# Patient Record
Sex: Female | Born: 1980 | Hispanic: Yes | Marital: Married | Smoking: Former smoker
Health system: Southern US, Community
[De-identification: ages and names within clinical notes are randomized; demographics above are authoritative.]

## PROBLEM LIST (undated history)

## (undated) DIAGNOSIS — R519 Headache, unspecified: Secondary | ICD-10-CM

## (undated) DIAGNOSIS — R51 Headache: Secondary | ICD-10-CM

## (undated) HISTORY — PX: DIAGNOSTIC LAPAROSCOPY: SUR761

## (undated) HISTORY — PX: TUBAL LIGATION: SHX77

## (undated) HISTORY — PX: CERVICAL BIOPSY  W/ LOOP ELECTRODE EXCISION: SUR135

---

## 2008-04-02 ENCOUNTER — Emergency Department: Payer: Self-pay | Admitting: Emergency Medicine

## 2013-07-31 ENCOUNTER — Ambulatory Visit: Payer: Self-pay | Admitting: Obstetrics and Gynecology

## 2013-07-31 LAB — HEMOGLOBIN: HGB: 12.9 g/dL (ref 12.0–16.0)

## 2013-08-08 ENCOUNTER — Ambulatory Visit: Payer: Self-pay | Admitting: Obstetrics and Gynecology

## 2013-08-11 LAB — PATHOLOGY REPORT

## 2014-08-15 NOTE — Op Note (Signed)
PATIENT NAME:  Lisa Dunn, Lisa Dunn MR#:  [id number] DATE OF BIRTH:  10-21-1980  DATE OF PROCEDURE:  08/08/2013  PREOPERATIVE DIAGNOSES:  1.  Chronic right pelvic pain.  2.  Intra-abdominal intrauterine device.  3.  Elective permanent sterilization.   POSTOPERATIVE DIAGNOSES: 1.  Chronic right pelvic pain.  2.  Intra-abdominal intrauterine device.  3.  Elective permanent sterilization.    PROCEDURES:  1.  Laparoscopic retrieval of intra-abdominal IUD. 2.  Distal right salpingectomy.  3.  Bilateral tubal cautery.  4.  Lysis of adhesions.   SURGEON: Laverta Baltimore, MD  ANESTHESIA: General endotracheal anesthesia.   INDICATIONS: This is a 34 year old, gravida 3, para 3  patient with a 5 history of right pelvic pain. The patient's workup documented an intra-abdominal IUD to the right side of the pelvis. The patient is also interested in permanent sterilization and reconfirms her choice for this prior to our procedure.   DESCRIPTION OF PROCEDURE: After adequate general endotracheal anesthesia, the patient's abdomen, perineum, and vagina were prepped. The patient was sterilely draped. A single-tooth tenaculum was applied on the anterior cervix and a Kahn cannula was placed into the endocervical canal to be used for uterine manipulation during the procedure. Gloves were changed. A 12 mm infraumbilical incision was made after injecting the skin with 0.5% Marcaine. The laparoscope was advanced into the abdominal cavity under direct visualization with the Optiview cannula. Once access to the abdominal cavity, the cavity was insufflated with carbon dioxide. The patient was placed in Trendelenburg. A second port site was placed 3 cm medial to the left anterior iliac spine and a 5 mm port was advanced into the abdominal cavity under direct visualization. Graspers were used to move the omentum. The IUD string was visualized and picked up and it was noted that the Mirena IUD was tangled with the  distal portion of the right fallopian tube. A third port site was placed, this time, right lower quadrant 3 cm medial to the right anterior iliac spine and Harmonic scalpel was brought up to the operative field. A distal right salpingectomy was performed with the IUD entangled with this. The IUD and the distal portion of fallopian tube were removed through the left port site. There were adhesions from the abdominal wall to the anterior uterus. These were taken down with the Harmonic scalpel as well. Each fallopian tube was then cauterized with the Kleppinger cautery at 4 separate places on the fallopian tube. Pictures were taken. The upper abdomen appeared normal and ovaries appeared normal.   The patient's abdomen was deflated and incisions were closed with the deep 2-0 Vicryl suture for the infraumbilical port site and then all skin incisions were closed with interrupted 4-0 Vicryl sutures, Dermabond placed and Tegaderm was placed over top of these. The single-tooth tenaculum was removed from the cervix and silver nitrate was used to control bleeding at the tenacula site. There were no complications. Of note, the patient's bladder was drained prior to commencement of the case and yielded 50 mL of urine. Intraoperative fluids 900 mL. The patient was taken to the recovery room in good condition.   ____________________________ Boykin Nearing, MD tjs:aw D: 08/08/2013 08:45:51 ET T: 08/08/2013 10:18:43 ET JOB#: [id number]  cc: Boykin Nearing, MD, <Dictator> Boykin Nearing MD ELECTRONICALLY SIGNED 08/08/2013 13:16

## 2015-02-15 NOTE — H&P (Addendum)
Pt will be scheduled for a TAH + Bilateral salpingectomy

## 2015-02-15 NOTE — H&P (Signed)
Ms. Lisa Dunn is a 34 y.o. female scheduled for a TAH / bilateral salpingectomy for menorrhagia and s/p 3 c/s  U/s shows a 4x3 cm posterior cervical fibroid . EMBX negative    Past Medical History:  has a past medical history of Cervical dysplasia; Clotting disorder (10/2014); Fibroid; Migraines; and Varicosities of leg.  Past Surgical History:  has a past surgical history that includes Cesarean section; Cervical biopsy w/ loop electrode excision; Pelvic laparoscopy; and Tubal ligation. Family History: family history includes Hypertension in her mother. Social History:  reports that she quit smoking about 8 years ago. She does not have any smokeless tobacco history on file. She reports that she drinks alcohol. She reports that she does not use illicit drugs. OB/GYN History:  OB History    Gravida Para Term Preterm AB TAB SAB Ectopic Multiple Living   3 3 3       3       Allergies: has No Known Allergies. Medications: No current outpatient prescriptions on file.  Review of Systems: General:   No fatigue or weight loss Eyes:   No vision changes Ears:   No hearing difficulty Respiratory:   No cough or shortness of breath Pulmonary:   No asthma or shortness of breath Cardiovascular:  No chest pain, palpitations, dyspnea on exertion Gastrointestinal:  No abdominal bloating, chronic diarrhea, constipations, masses, pain or hematochezia Genitourinary:  No hematuria, dysuria, abnormal vaginal discharge, pelvic pain, Menometrorrhagia Lymphatic:  No swollen lymph nodes Musculoskeletal: No muscle weakness Neurologic:  No extremity weakness, syncope, seizure disorder Psychiatric:  No history of depression, delusions or suicidal/homicidal ideation   Exam:      Vitals:   01/22/15 0923  BP: 114/81  Pulse: 76    Body mass index is 28.72 kg/(m^2).  WDWN hispanic female in NAD  Lungs: CTA  CV : RRR without murmur   Neck: no  thyromegaly Abdomen: soft , no mass, normal active bowel sounds, non-tender, no rebound tenderness Pelvic: tanner stage 5 ,  External genitalia: vulva /labia no lesions Urethra: no prolapse Vagina: normal physiologic d/c Cervix: no lesions, no cervical motion tenderness  Uterus: normal size shape and contour, non-tender Adnexa: no mass, non-tender  Rectovaginal:  SIS: done no evidence of endometrial pathology   Impression:   The primary encounter diagnosis was Menorrhagia with regular cycle. Diagnoses of Dyspareunia (CODE) and Intramural leiomyoma of uterus were also pertinent to this visit.  3 prior c/s . Possible pelvic adhesions  Plan:   Scheduled for TAH and bilat salpingectomy  Risks and benefits discussed with the patient . All questions answered .  Gwen Her [name] MD

## 2015-02-16 ENCOUNTER — Encounter
Admission: RE | Admit: 2015-02-16 | Discharge: 2015-02-16 | Disposition: A | Discharge status: Home / Self Care | Payer: BLUE CROSS/BLUE SHIELD | Source: Ambulatory Visit | Attending: Obstetrics and Gynecology | Admitting: Obstetrics and Gynecology

## 2015-02-16 DIAGNOSIS — Z01818 Encounter for other preprocedural examination: Secondary | ICD-10-CM | POA: Insufficient documentation

## 2015-02-16 DIAGNOSIS — N920 Excessive and frequent menstruation with regular cycle: Secondary | ICD-10-CM | POA: Diagnosis not present

## 2015-02-16 HISTORY — DX: Headache, unspecified: R51.9

## 2015-02-16 HISTORY — DX: Headache: R51

## 2015-02-16 LAB — CBC
HCT: 37.3 % (ref 35.0–47.0)
HEMOGLOBIN: 12.6 g/dL (ref 12.0–16.0)
MCH: 29.2 pg (ref 26.0–34.0)
MCHC: 33.8 g/dL (ref 32.0–36.0)
MCV: 86.3 fL (ref 80.0–100.0)
PLATELETS: 255 10*3/uL (ref 150–440)
RBC: 4.32 MIL/uL (ref 3.80–5.20)
RDW: 13.6 % (ref 11.5–14.5)
WBC: 6.4 10*3/uL (ref 3.6–11.0)

## 2015-02-16 LAB — BASIC METABOLIC PANEL
ANION GAP: 6 (ref 5–15)
BUN: 12 mg/dL (ref 6–20)
CHLORIDE: 106 mmol/L (ref 101–111)
CO2: 24 mmol/L (ref 22–32)
Calcium: 8.9 mg/dL (ref 8.9–10.3)
Creatinine, Ser: 0.7 mg/dL (ref 0.44–1.00)
GFR calc Af Amer: >60 mL/min (ref >60)
GFR calc non Af Amer: >60 mL/min (ref >60)
Glucose, Bld: 98 mg/dL (ref 65–99)
POTASSIUM: 4.2 mmol/L (ref 3.5–5.1)
SODIUM: 136 mmol/L (ref 135–145)

## 2015-02-16 LAB — ABO/RH: ABO/RH(D): O NEG

## 2015-02-16 LAB — TYPE AND SCREEN
ABO/RH(D): O NEG
Antibody Screen: NEGATIVE

## 2015-02-16 MED ORDER — FLEET ENEMA 7-19 GM/118ML RE ENEM
1.0000 | ENEMA | Freq: Once | RECTAL | Status: DC
  Filled 2015-02-16: qty 1

## 2015-02-16 NOTE — Patient Instructions (Signed)
  Your procedure is scheduled on: March 01, 2015 (Monday) Report to Day Surgery.[facility]) To find out your arrival time please call [phone] between 1PM - 3PM on February 26, 2015(Friday)  Remember: Instructions that are not followed completely may result in serious medical risk, up to and including death, or upon the discretion of your surgeon and anesthesiologist your surgery may need to be rescheduled.    __x__ 1. Do not eat food or drink liquids after midnight. No gum chewing or hard candies.     __x__ 2. No Alcohol for 24 hours before or after surgery.   ____ 3. Bring all medications with you on the day of surgery if instructed.    _x___ 4. Notify your doctor if there is any change in your medical condition     (cold, fever, infections).     Do not wear jewelry, make-up, hairpins, clips or nail polish.  Do not wear lotions, powders, or perfumes. You may wear deodorant.  Do not shave 48 hours prior to surgery. Men may shave face and neck.  Do not bring valuables to the hospital.    [facility] is not responsible for any belongings or valuables.               Contacts, dentures or bridgework may not be worn into surgery.  Leave your suitcase in the car. After surgery it may be brought to your room.  For patients admitted to the hospital, discharge time is determined by your                treatment team.   Patients discharged the day of surgery will not be allowed to drive home.   Please read over the following fact sheets that you were given:   Surgical Site Infection Prevention   ____ Take these medicines the morning of surgery with A SIP OF WATER:    1.   2.   3.   4.  5.  6.  __x__ Fleet Enema (as directed) (Early morning of surgery)   _x___ Use CHG Soap as directed  ____ Use inhalers on the day of surgery  ____ Stop metformin 2 days prior to surgery    ____ Take 1/2 of usual insulin dose the night before surgery and none on the morning of surgery.    ____ Stop Coumadin/Plavix/aspirin on   _x___ Stop Anti-inflammatories on (Stop Ibuprofen now, Tylenol ok to take for pain if needed)   ____ Stop supplements until after surgery.    ____ Bring C-Pap to the hospital. ([place] to hospital the day of surgery)

## 2015-03-01 ENCOUNTER — Inpatient Hospital Stay: Payer: BLUE CROSS/BLUE SHIELD | Admitting: Anesthesiology

## 2015-03-01 ENCOUNTER — Encounter
Admission: AD | Disposition: A | Discharge status: Home / Self Care | Payer: Self-pay | Source: Ambulatory Visit | Attending: Obstetrics and Gynecology

## 2015-03-01 ENCOUNTER — Inpatient Hospital Stay
Admission: AD | Admit: 2015-03-01 | Discharge: 2015-03-04 | DRG: 743 | Disposition: A | Discharge status: Home / Self Care | Payer: BLUE CROSS/BLUE SHIELD | Source: Ambulatory Visit | Attending: Obstetrics and Gynecology | Admitting: Obstetrics and Gynecology

## 2015-03-01 DIAGNOSIS — D251 Intramural leiomyoma of uterus: Principal | ICD-10-CM | POA: Diagnosis present

## 2015-03-01 DIAGNOSIS — Z87891 Personal history of nicotine dependence: Secondary | ICD-10-CM | POA: Diagnosis not present

## 2015-03-01 DIAGNOSIS — N920 Excessive and frequent menstruation with regular cycle: Secondary | ICD-10-CM | POA: Diagnosis present

## 2015-03-01 DIAGNOSIS — D260 Other benign neoplasm of cervix uteri: Secondary | ICD-10-CM | POA: Diagnosis present

## 2015-03-01 DIAGNOSIS — Z8249 Family history of ischemic heart disease and other diseases of the circulatory system: Secondary | ICD-10-CM | POA: Diagnosis not present

## 2015-03-01 DIAGNOSIS — Z87410 Personal history of cervical dysplasia: Secondary | ICD-10-CM | POA: Diagnosis not present

## 2015-03-01 DIAGNOSIS — N941 Unspecified dyspareunia: Secondary | ICD-10-CM | POA: Diagnosis present

## 2015-03-01 DIAGNOSIS — Z9889 Other specified postprocedural states: Secondary | ICD-10-CM

## 2015-03-01 HISTORY — PX: BILATERAL SALPINGECTOMY: SHX5743

## 2015-03-01 HISTORY — PX: ABDOMINAL HYSTERECTOMY: SHX81

## 2015-03-01 LAB — POCT PREGNANCY, URINE: PREG TEST UR: NEGATIVE

## 2015-03-01 SURGERY — HYSTERECTOMY, ABDOMINAL
Anesthesia: General | Site: Uterus | Wound class: Clean Contaminated

## 2015-03-01 MED ORDER — ONDANSETRON HCL 4 MG PO TABS: 4.0000 mg | ORAL_TABLET | Freq: Four times a day (QID) | ORAL | Status: DC | PRN

## 2015-03-01 MED ORDER — FENTANYL CITRATE (PF) 100 MCG/2ML IJ SOLN
25.0000 ug | INTRAMUSCULAR | Status: DC | PRN
  Administered 2015-03-01 (×4): 25 ug via INTRAVENOUS

## 2015-03-01 MED ORDER — CEFOXITIN SODIUM-DEXTROSE 2-2.2 GM-% IV SOLR (PREMIX)
2.0000 g | INTRAVENOUS | Status: AC
  Administered 2015-03-01: 2000 mg via INTRAVENOUS
  Administered 2015-03-01: 2 g via INTRAVENOUS

## 2015-03-01 MED ORDER — ONDANSETRON HCL 4 MG/2ML IJ SOLN: 4.0000 mg | Freq: Four times a day (QID) | INTRAMUSCULAR | Status: DC | PRN

## 2015-03-01 MED ORDER — MORPHINE SULFATE 2 MG/ML IV SOLN
INTRAVENOUS | Status: DC
  Administered 2015-03-01: 1 mg via INTRAVENOUS
  Administered 2015-03-02: 15.5 mg via INTRAVENOUS

## 2015-03-01 MED ORDER — DEXAMETHASONE SODIUM PHOSPHATE 4 MG/ML IJ SOLN
INTRAMUSCULAR | Status: DC | PRN
  Administered 2015-03-01: 5 mg via INTRAVENOUS

## 2015-03-01 MED ORDER — HYDROMORPHONE HCL 1 MG/ML IJ SOLN
INTRAMUSCULAR | Status: DC | PRN
  Administered 2015-03-01: .6 mg via INTRAVENOUS
  Administered 2015-03-01: .4 mg via INTRAVENOUS

## 2015-03-01 MED ORDER — FAMOTIDINE 20 MG PO TABS
ORAL_TABLET | ORAL | Status: AC
  Administered 2015-03-01: 20 mg via ORAL
  Filled 2015-03-01: qty 1

## 2015-03-01 MED ORDER — ONDANSETRON HCL 4 MG/2ML IJ SOLN: 4.0000 mg | Freq: Once | INTRAMUSCULAR | Status: DC | PRN

## 2015-03-01 MED ORDER — ROCURONIUM BROMIDE 100 MG/10ML IV SOLN
INTRAVENOUS | Status: DC | PRN
  Administered 2015-03-01 (×2): 10 mg via INTRAVENOUS
  Administered 2015-03-01: 30 mg via INTRAVENOUS
  Administered 2015-03-01: 10 mg via INTRAVENOUS

## 2015-03-01 MED ORDER — ACETAMINOPHEN 10 MG/ML IV SOLN
INTRAVENOUS | Status: AC
  Filled 2015-03-01: qty 100

## 2015-03-01 MED ORDER — SODIUM CHLORIDE 0.9 % IJ SOLN: 9.0000 mL | INTRAMUSCULAR | Status: DC | PRN

## 2015-03-01 MED ORDER — SUGAMMADEX SODIUM 200 MG/2ML IV SOLN: INTRAVENOUS | Status: DC | PRN

## 2015-03-01 MED ORDER — SUGAMMADEX SODIUM 200 MG/2ML IV SOLN
INTRAVENOUS | Status: DC | PRN
  Administered 2015-03-01: 50 mg via INTRAVENOUS
  Administered 2015-03-01: 150 mg via INTRAVENOUS

## 2015-03-01 MED ORDER — EPHEDRINE SULFATE 50 MG/ML IJ SOLN
INTRAMUSCULAR | Status: DC | PRN
  Administered 2015-03-01: 10 mg via INTRAVENOUS

## 2015-03-01 MED ORDER — SENNOSIDES-DOCUSATE SODIUM 8.6-50 MG PO TABS: 1.0000 | ORAL_TABLET | Freq: Every evening | ORAL | Status: DC | PRN

## 2015-03-01 MED ORDER — LACTATED RINGERS IV SOLN
INTRAVENOUS | Status: DC
  Administered 2015-03-01 – 2015-03-02 (×3): via INTRAVENOUS

## 2015-03-01 MED ORDER — LIDOCAINE HCL (CARDIAC) 20 MG/ML IV SOLN
INTRAVENOUS | Status: DC | PRN
  Administered 2015-03-01: 60 mg via INTRAVENOUS

## 2015-03-01 MED ORDER — MIDAZOLAM HCL 2 MG/2ML IJ SOLN
INTRAMUSCULAR | Status: DC | PRN
  Administered 2015-03-01: 2 mg via INTRAVENOUS

## 2015-03-01 MED ORDER — LACTATED RINGERS IV SOLN
INTRAVENOUS | Status: DC
Start: 2015-03-01 — End: 2015-03-01

## 2015-03-01 MED ORDER — CEFOXITIN SODIUM-DEXTROSE 2-2.2 GM-% IV SOLR (PREMIX)
INTRAVENOUS | Status: AC
  Administered 2015-03-01: 2000 mg via INTRAVENOUS
  Filled 2015-03-01: qty 50

## 2015-03-01 MED ORDER — DEXMEDETOMIDINE HCL IN NACL 200 MCG/50ML IV SOLN
INTRAVENOUS | Status: DC | PRN
  Administered 2015-03-01: 10 ug via INTRAVENOUS

## 2015-03-01 MED ORDER — SIMETHICONE 80 MG PO CHEW
80.0000 mg | CHEWABLE_TABLET | Freq: Four times a day (QID) | ORAL | Status: DC | PRN
  Administered 2015-03-03: 80 mg via ORAL
  Filled 2015-03-01: qty 1

## 2015-03-01 MED ORDER — NALOXONE HCL 0.4 MG/ML IJ SOLN: 0.4000 mg | INTRAMUSCULAR | Status: DC | PRN

## 2015-03-01 MED ORDER — FAMOTIDINE 20 MG PO TABS
20.0000 mg | ORAL_TABLET | Freq: Once | ORAL | Status: AC
  Administered 2015-03-01: 20 mg via ORAL

## 2015-03-01 MED ORDER — LACTATED RINGERS IV SOLN
INTRAVENOUS | Status: DC
  Administered 2015-03-01 (×2): via INTRAVENOUS

## 2015-03-01 MED ORDER — FENTANYL CITRATE (PF) 100 MCG/2ML IJ SOLN
INTRAMUSCULAR | Status: DC | PRN
  Administered 2015-03-01: 50 ug via INTRAVENOUS
  Administered 2015-03-01: 100 ug via INTRAVENOUS

## 2015-03-01 MED ORDER — MORPHINE SULFATE 2 MG/ML IV SOLN
INTRAVENOUS | Status: AC
  Administered 2015-03-01: 1 mg
  Filled 2015-03-01: qty 25

## 2015-03-01 MED ORDER — KETOROLAC TROMETHAMINE 15 MG/ML IJ SOLN
INTRAMUSCULAR | Status: DC | PRN
  Administered 2015-03-01: 30 mg via INTRAVENOUS

## 2015-03-01 MED ORDER — DIPHENHYDRAMINE HCL 12.5 MG/5ML PO ELIX
12.5000 mg | ORAL_SOLUTION | Freq: Four times a day (QID) | ORAL | Status: DC | PRN
  Filled 2015-03-01: qty 5

## 2015-03-01 MED ORDER — ONDANSETRON HCL 4 MG/2ML IJ SOLN
INTRAMUSCULAR | Status: DC | PRN
Start: 2015-03-01 — End: 2015-03-01
  Administered 2015-03-01: 4 mg via INTRAVENOUS

## 2015-03-01 MED ORDER — PROPOFOL 10 MG/ML IV BOLUS
INTRAVENOUS | Status: DC | PRN
  Administered 2015-03-01: 160 mg via INTRAVENOUS

## 2015-03-01 MED ORDER — ACETAMINOPHEN 10 MG/ML IV SOLN
1000.0000 mg | Freq: Four times a day (QID) | INTRAVENOUS | Status: DC
  Administered 2015-03-01 – 2015-03-02 (×3): 1000 mg via INTRAVENOUS
  Filled 2015-03-01 (×4): qty 100

## 2015-03-01 MED ORDER — DIPHENHYDRAMINE HCL 50 MG/ML IJ SOLN: 12.5000 mg | Freq: Four times a day (QID) | INTRAMUSCULAR | Status: DC | PRN

## 2015-03-01 MED ORDER — FENTANYL CITRATE (PF) 100 MCG/2ML IJ SOLN
INTRAMUSCULAR | Status: AC
  Administered 2015-03-01: 25 ug via INTRAVENOUS
  Filled 2015-03-01: qty 2

## 2015-03-01 SURGICAL SUPPLY — 35 items
ARISTA IMPLANT
CANISTER SUCT 1200ML W/VALVE (MISCELLANEOUS) ×4 IMPLANT
CATH TRAY 16F METER LATEX (MISCELLANEOUS) ×4 IMPLANT
CHLORAPREP W/TINT 26ML (MISCELLANEOUS) ×4 IMPLANT
DRAPE LAP W/FLUID (DRAPES) ×4 IMPLANT
DRAPE UNDER BUTTOCK W/FLU (DRAPES) ×4 IMPLANT
DRSG TELFA 3X8 NADH (GAUZE/BANDAGES/DRESSINGS) ×4 IMPLANT
ELECT CAUTERY BLADE 6.4 (BLADE) ×4 IMPLANT
GAUZE SPONGE 4X4 12PLY STRL (GAUZE/BANDAGES/DRESSINGS) ×4 IMPLANT
GLOVE BIO SURGEON STRL SZ8 (GLOVE) ×4 IMPLANT
GOWN STRL REUS W/ TWL LRG LVL3 (GOWN DISPOSABLE) ×4 IMPLANT
GOWN STRL REUS W/ TWL XL LVL3 (GOWN DISPOSABLE) ×2 IMPLANT
GOWN STRL REUS W/TWL LRG LVL3 (GOWN DISPOSABLE) ×4
GOWN STRL REUS W/TWL XL LVL3 (GOWN DISPOSABLE) ×2
HEMOSTAT ARISTA ABSORB 1G (Miscellaneous) ×4 IMPLANT
KIT RM TURNOVER CYSTO AR (KITS) ×4 IMPLANT
LABEL OR SOLS (LABEL) ×4 IMPLANT
PACK BASIN MAJOR ARMC (MISCELLANEOUS) ×4 IMPLANT
PAD GROUND ADULT SPLIT (MISCELLANEOUS) ×4 IMPLANT
RETAINER VISCERA MED (MISCELLANEOUS) IMPLANT
SOL PREP PVP 2OZ (MISCELLANEOUS) ×4
SOLUTION PREP PVP 2OZ (MISCELLANEOUS) ×2 IMPLANT
SPONGE LAP 18X36 2PK (MISCELLANEOUS) ×4 IMPLANT
SPONGE XRAY 4X4 16PLY STRL (MISCELLANEOUS) ×4 IMPLANT
STAPLER SKIN PROX 35W (STAPLE) ×4 IMPLANT
SURGILUBE 2OZ TUBE FLIPTOP (MISCELLANEOUS) ×4 IMPLANT
SUT VIC AB 0 CT1 27 (SUTURE) ×6
SUT VIC AB 0 CT1 27XCR 8 STRN (SUTURE) ×6 IMPLANT
SUT VIC AB 0 CT1 36 (SUTURE) ×8 IMPLANT
SUT VIC AB 2-0 SH 27 (SUTURE) ×4
SUT VIC AB 2-0 SH 27XBRD (SUTURE) ×4 IMPLANT
SUT VICRYL PLUS ABS 0 54 (SUTURE) ×4 IMPLANT
SYR BULB IRRIG 60ML STRL (SYRINGE) ×4 IMPLANT
TRAY PREP VAG/GEN (MISCELLANEOUS) ×4 IMPLANT
WATER STERILE IRR 1000ML POUR (IV SOLUTION) ×4 IMPLANT

## 2015-03-01 NOTE — Transfer of Care (Signed)
Immediate Anesthesia Transfer of Care Note  Patient: Lisa Dunn  Procedure(s) Performed: Procedure(s): HYSTERECTOMY ABDOMINAL (N/A) BILATERAL SALPINGECTOMY  Patient Location: PACU  Anesthesia Type:General  Level of Consciousness: awake, alert  and oriented  Airway & Oxygen Therapy: Patient Spontanous Breathing and Patient connected to face mask oxygen  Post-op Assessment: Report given to RN and Post -op Vital signs reviewed and stable  Post vital signs: Reviewed and stable  Last Vitals:  Filed Vitals:   03/01/15 0917  BP:   Pulse:   Temp: 36.3 C  Resp:     Complications: No apparent anesthesia complications

## 2015-03-01 NOTE — Progress Notes (Signed)
Patient ID: Lisa Dunn, female   DOB: Oct 11, 1980, 34 y.o.   MRN: [id number] DOS some pain , currently on PCA  VSS:  abd soft  Good OU  A; stable  Plans cont PCA . NPO

## 2015-03-01 NOTE — Progress Notes (Signed)
Patient is hemodynamically stable w/o anu acute event. She remained on room and was able to maintain O2sat above 90%. Patient's was on PCA overnight, stated that her pain is well controlled. Husband was at bedside overnight. Will continue to monitor.  Patient care handed over to [facility] @0200 

## 2015-03-01 NOTE — Progress Notes (Signed)
Pt interviewed , no new concerns . Labs normal . Pt ready for TAH / bilateral salpingectomy .All questions answered

## 2015-03-01 NOTE — Anesthesia Preprocedure Evaluation (Addendum)
Anesthesia Evaluation  Patient identified by MRN, date of birth, ID band Patient awake    Reviewed: Allergy & Precautions, NPO status , Patient's Chart, lab work & pertinent test results, reviewed documented beta blocker date and time   Airway Mallampati: II  TM Distance: >3 FB     Dental  (+) Chipped   Pulmonary former smoker,           Cardiovascular      Neuro/Psych  Headaches,    GI/Hepatic   Endo/Other    Renal/GU      Musculoskeletal   Abdominal   Peds  Hematology   Anesthesia Other Findings Cardiac studies ok. Overbite.  Reproductive/Obstetrics                            Anesthesia Physical Anesthesia Plan  ASA: II  Anesthesia Plan: General   Post-op Pain Management:    Induction: Intravenous  Airway Management Planned: Oral ETT  Additional Equipment:   Intra-op Plan:   Post-operative Plan:   Informed Consent: I have reviewed the patients History and Physical, chart, labs and discussed the procedure including the risks, benefits and alternatives for the proposed anesthesia with the patient or authorized representative who has indicated his/her understanding and acceptance.     Plan Discussed with: CRNA  Anesthesia Plan Comments:         Anesthesia Quick Evaluation

## 2015-03-01 NOTE — Anesthesia Procedure Notes (Signed)
Procedure Name: Intubation Date/Time: 03/01/2015 10:03 AM Performed by: Justus Memory Pre-anesthesia Checklist: Patient identified, Emergency Drugs available, Suction available and Patient being monitored Patient Re-evaluated:Patient Re-evaluated prior to inductionOxygen Delivery Method: Circle system utilized Preoxygenation: Pre-oxygenation with 100% oxygen Intubation Type: IV induction Ventilation: Mask ventilation without difficulty Laryngoscope Size: Mac and 3 Grade View: Grade I Tube type: Oral Tube size: 7.0 mm Number of attempts: 1 Airway Equipment and Method: Stylet Placement Confirmation: ETT inserted through vocal cords under direct vision,  positive ETCO2,  CO2 detector and breath sounds checked- equal and bilateral Secured at: 20 cm Tube secured with: Tape Dental Injury: Teeth and Oropharynx as per pre-operative assessment

## 2015-03-01 NOTE — Brief Op Note (Signed)
03/01/2015  11:59 AM  PATIENT:  Lisa Dunn  34 y.o. female  PRE-OPERATIVE DIAGNOSIS:  MENORRHAGIA, fibroid , dyspareunia POST-OPERATIVE DIAGNOSIS:  MENORRHAGIA  PROCEDURE:  TAH , left salpingectomy  SURGEON:  Surgeon(s) and Role:    * Boykin Nearing, MD - Primary    * Benjaman Kindler, MD - Assisting  PHYSICIAN ASSISTANT:   ASSISTANTS: none   ANESTHESIA:   general  EBL:  Total I/O In: 1000 [I.V.:1000] Out: -   BLOOD ADMINISTERED:none  DRAINS: Urinary Catheter (Foley)   LOCAL MEDICATIONS USED:  NONE  SPECIMEN:  No Specimen and Source of Specimen:  uterus with fibroid , cervix and left fallopian tube   DISPOSITION OF SPECIMEN:  PATHOLOGY  COUNTS:  YES  TOURNIQUET:  * No tourniquets in log *  DICTATION: .Other Dictation: Dictation Number verbal  PLAN OF CARE: Admit to inpatient   PATIENT DISPOSITION:  PACU - hemodynamically stable.   Delay start of Pharmacological VTE agent (>24hrs) due to surgical blood loss or risk of bleeding: not applicable

## 2015-03-02 LAB — CBC
HCT: 29 % — ABNORMAL LOW (ref 35.0–47.0)
Hemoglobin: 10.3 g/dL — ABNORMAL LOW (ref 12.0–16.0)
MCH: 30.9 pg (ref 26.0–34.0)
MCHC: 35.5 g/dL (ref 32.0–36.0)
MCV: 87 fL (ref 80.0–100.0)
PLATELETS: 243 10*3/uL (ref 150–440)
RBC: 3.33 MIL/uL — ABNORMAL LOW (ref 3.80–5.20)
RDW: 13.5 % (ref 11.5–14.5)
WBC: 7.2 10*3/uL (ref 3.6–11.0)

## 2015-03-02 LAB — BASIC METABOLIC PANEL
Anion gap: 4 — ABNORMAL LOW (ref 5–15)
BUN: 10 mg/dL (ref 6–20)
CHLORIDE: 103 mmol/L (ref 101–111)
CO2: 27 mmol/L (ref 22–32)
CREATININE: 0.62 mg/dL (ref 0.44–1.00)
Calcium: 8.2 mg/dL — ABNORMAL LOW (ref 8.9–10.3)
GLUCOSE: 99 mg/dL (ref 65–99)
POTASSIUM: 3.6 mmol/L (ref 3.5–5.1)
SODIUM: 134 mmol/L — AB (ref 135–145)

## 2015-03-02 MED ORDER — DOCUSATE SODIUM 100 MG PO CAPS
100.0000 mg | ORAL_CAPSULE | Freq: Two times a day (BID) | ORAL | Status: DC
  Administered 2015-03-02 – 2015-03-04 (×5): 100 mg via ORAL
  Filled 2015-03-02 (×5): qty 1

## 2015-03-02 MED ORDER — OXYCODONE-ACETAMINOPHEN 5-325 MG PO TABS
1.0000 | ORAL_TABLET | ORAL | Status: DC | PRN
  Administered 2015-03-02: 2 via ORAL
  Administered 2015-03-02: 1 via ORAL
  Administered 2015-03-02 – 2015-03-03 (×4): 2 via ORAL
  Administered 2015-03-03: 1 via ORAL
  Administered 2015-03-03 – 2015-03-04 (×3): 2 via ORAL
  Filled 2015-03-02 (×5): qty 2
  Filled 2015-03-02: qty 1
  Filled 2015-03-02 (×2): qty 2
  Filled 2015-03-02: qty 1
  Filled 2015-03-02: qty 2

## 2015-03-02 MED ORDER — NAPROXEN 500 MG PO TABS
500.0000 mg | ORAL_TABLET | Freq: Two times a day (BID) | ORAL | Status: DC
  Administered 2015-03-02 – 2015-03-04 (×4): 500 mg via ORAL
  Filled 2015-03-02 (×6): qty 1

## 2015-03-02 NOTE — Anesthesia Postprocedure Evaluation (Signed)
  Anesthesia Post-op Note  Patient: Lisa Dunn  Procedure(s) Performed: Procedure(s): HYSTERECTOMY ABDOMINAL (N/A) BILATERAL SALPINGECTOMY  Anesthesia type:General  Patient location: PACU  Post pain: Pain level controlled  Post assessment: Post-op Vital signs reviewed, Patient's Cardiovascular Status Stable, Respiratory Function Stable, Patent Airway and No signs of Nausea or vomiting  Post vital signs: Reviewed and stable  Last Vitals:  Filed Vitals:   03/02/15 1107  BP: 114/76  Pulse: 76  Temp: 37.1 C  Resp: 18    Level of consciousness: awake, alert  and patient cooperative  Complications: No apparent anesthesia complications

## 2015-03-02 NOTE — Progress Notes (Signed)
1 Day Post-Op Procedure(s) (LRB): HYSTERECTOMY ABDOMINAL (N/A) BILATERAL SALPINGECTOMY  Subjective: Patient reports no problems voiding.  No c/o . Pain in good control   Objective: I have reviewed patient's vital signs. And labs   General: alert and cooperative Resp: clear to auscultation bilaterally Cardio: regular rate and rhythm, S1, S2 normal, no murmur, click, rub or gallop GI: hypoactive BS   Assessment: s/p Procedure(s): HYSTERECTOMY ABDOMINAL (N/A) BILATERAL SALPINGECTOMY: stable  Plan: Advance diet Advance to PO medication Discontinue IV fluids Clear liquids  LOS: 1 day    SCHERMERHORN,THOMAS 03/02/2015, 8:43 AM

## 2015-03-02 NOTE — Op Note (Signed)
NAMEMarland Kitchen  Dunn, GROSVENOR NO.:  [id number]  MEDICAL RECORD NO.:  [id number]  LOCATION:  348A                         FACILITY:  [facility]  PHYSICIAN:  Laverta Baltimore, MDDATE OF BIRTH:  05-12-1980  DATE OF PROCEDURE: DATE OF DISCHARGE:                              OPERATIVE REPORT   PREOPERATIVE DIAGNOSIS: 1. Menorrhagia. 2. Fibroid uterus. 3. Dyspareunia.  POSTOPERATIVE DIAGNOSIS: 1. Menorrhagia. 2. Fibroid uterus. 3. Dyspareunia.  PROCEDURES: 1. Total abdominal hysterectomy. 2. Left salpingectomy.  ANESTHESIA:  General endotracheal anesthesia.  SURGEON:  Laverta Baltimore, M.D.  FIRST ASSISTANT:  Leafy Ro.  INDICATIONS:  A 34 year old gravida 3, para 3 patient with a long history of menorrhagia, not controlled by conservative therapy.  The patient also with a known 4 cm cervical fibroid and dyspareunia.  The patient elects for definitive surgical intervention.  DESCRIPTION OF PROCEDURE:  After adequate general endotracheal anesthesia, the patient was placed in dorsal supine position.  Legs were placed in the Burna.  SCDs were placed on the legs previously. The patient was prepped and draped in normal sterile fashion.  Time-out was performed.  The patient did receive 2 g IV cefoxitin prior to commencement of the case and a Foley catheter was placed intraoperatively.  A Pfannenstiel incision was made 2 fingerbreadths above the symphysis pubis.  Sharp dissection was used to identify the fascia.  Fascia was opened in the midline and opened in a transverse fashion.  Superior aspect of the fascia was grasped with Kocher clamps and the recti muscles were dissected free.  Inferior aspect of the fascia was grasped with Kocher clamps and the pyramidalis muscle was dissected free.  Entry into the peritoneal cavity was accomplished sharply.  An O'Connor-O'Sullivan retractor was placed into the abdomen and the bowel was packed cephalad.  Two large  Kelly clamps were placed on the uterine cornua bilaterally used for retraction.  The round ligaments on both sides were clamped, transected, suture ligated with 0 Vicryl suture.  Anterior leaf of the broad ligament was incised along the bladder reflection.  There were some dense adhesions that were removed sharply.  Bladder was gently dissected off the lower uterine segment with a sponge stick thereafter.  Window was placed in both broad ligaments and the uterine cornua were bilaterally clamped, transected, and suture ligated with 0 Vicryl suture.  Uterine arteries were skeletonized bilaterally, clamped with Haney clamps, and suture ligated with 0 Vicryl suture.  Of note, there was a 4 cm multi-lobed fibroid that pushed off into the right broad ligament after clamping the right uterine artery and ligating this artery, the fibroid was enucleated after incising the serosa and grasping the fibroid with a double tooth tenaculum and with traction, removing the fibroid after securing the blood supply at its base.  Ureters were identified bilaterally with normal peristaltic activity prior to performing this enucleation of the right cervical fibroid.  The uterosacral ligaments were clamped on both sides, transected, and suture ligated with 0 Vicryl suture and the vaginal angles were then clamped and the uterus and cervix were removed intact.  Vaginal cuff was then closed with interrupted 0 Vicryl suture. Again, good ureteral peristalsis was identified at the end of the case. There was a small amount  of oozing at the vaginal cuff, juxtaposed to the bladder, this required 2 separate 2-0 Vicryl figure-of-eight sutures to control hemostasis.  Arista was used on the peritoneal area that was oozing close to the right ureter.  Good hemostasis resulted.  The patient's abdomen was copiously irrigated and good hemostasis was noted. All suture were then cut and the laparotomy sponges were removed. Sponge  count was correct.  The left fallopian tube was grasped with a Babcock clamp and the distal portion of fallopian tube was clamped and removed and suture ligated with 0 Vicryl suture.  There was no evidence of remaining fallopian tube on the patient's right side.  The patient's fascia was closed with 0 Vicryl suture in a running nonlocking fashion, 2 separate sutures were used.  Subcutaneous tissues were irrigated and bovied for hemostasis and the skin was reapproximated with staples.  COMPLICATIONS:  There were no complications.  ESTIMATED BLOOD LOSS:  150 mL.  URINE OUTPUT:  200 mL.  INTRAOPERATIVE FLUIDS:  800 mL.  The patient was taken to the recovery room in good condition.          ______________________________ Laverta Baltimore, MD     TS/MEDQ  D:  03/01/2015  T:  03/02/2015  Job:  [id number]

## 2015-03-03 LAB — SURGICAL PATHOLOGY

## 2015-03-03 NOTE — Progress Notes (Signed)
2 Days Post-Op Procedure(s) (LRB): HYSTERECTOMY ABDOMINAL (N/A) Left salpingectomy   Subjective: Patient reports + flatus.  Some pain   Objective: I have reviewed patient's vital signs.  General: alert and cooperative Resp: clear to auscultation bilaterally Cardio: regular rate and rhythm, S1, S2 normal, no murmur, click, rub or gallop GI: soft, non-tender; bowel sounds normal; no masses,  no organomegaly Incision  C/D/I Assessment: s/p Procedure(s): HYSTERECTOMY ABDOMINAL (N/A) BILATERAL SALPINGECTOMY: stable  Plan: Advance diet, pt may shower , d/c IV   LOS: 2 days    Tawyna Pellot 03/03/2015, 6:53 AM

## 2015-03-04 MED ORDER — SIMETHICONE 80 MG PO CHEW: 80.0000 mg | CHEWABLE_TABLET | Freq: Four times a day (QID) | ORAL | Status: DC | PRN

## 2015-03-04 MED ORDER — HYDROCODONE-ACETAMINOPHEN 5-325 MG PO TABS
1.0000 | ORAL_TABLET | Freq: Four times a day (QID) | ORAL | Status: DC | PRN
Start: 2015-03-04 — End: 2015-08-30

## 2015-03-04 MED ORDER — NAPROXEN 500 MG PO TABS: 500.0000 mg | ORAL_TABLET | Freq: Two times a day (BID) | ORAL | Status: DC

## 2015-03-04 MED ORDER — DOCUSATE SODIUM 100 MG PO CAPS: 100.0000 mg | ORAL_CAPSULE | Freq: Two times a day (BID) | ORAL | Status: DC

## 2015-03-04 NOTE — Discharge Summary (Signed)
Physician Discharge Summary  Patient ID: Lisa Dunn MRN: [id number] DOB/AGE: 34-08-1980 34 y.o.  Admit date: 03/01/2015 Discharge date: 03/04/2015  Admission Diagnoses:menorrhagia , fibroids , dyspareunia  Discharge Diagnoses: same  Active Problems:   Post-operative state   Discharged Condition: good  Hospital Course: pt under went a TAH and left salpingectomy . Recovery was uncomplicated . Tolerated reg diet and +flatus . Pain in good control   Consults: None  Significant Diagnostic Studies: labs: pod#1 HCT 29%  Treatments: n/a  Discharge Exam: Blood pressure 92/52, pulse 68, temperature 98.7 F (37.1 C), temperature source Oral, resp. rate 18, height 5\' 2"  (1.575 m), weight 158 lb (71.668 kg), last menstrual period 02/23/2015, SpO2 99 %. General appearance: alert and cooperative  Lungs CTA  cv rrr  aBD SOFT + bs  Incision C/D/I  Disposition: Final discharge disposition not confirmed  Discharge Instructions    Call MD for:  difficulty breathing, headache or visual disturbances    Complete by:  As directed      Call MD for:  extreme fatigue    Complete by:  As directed      Call MD for:  hives    Complete by:  As directed      Call MD for:  persistant dizziness or light-headedness    Complete by:  As directed      Call MD for:  persistant nausea and vomiting    Complete by:  As directed      Call MD for:  redness, tenderness, or signs of infection (pain, swelling, redness, odor or green/yellow discharge around incision site)    Complete by:  As directed      Call MD for:  severe uncontrolled pain    Complete by:  As directed      Call MD for:  temperature >100.4    Complete by:  As directed      Diet - low sodium heart healthy    Complete by:  As directed      Increase activity slowly    Complete by:  As directed      Remove staples    Complete by:  As directed   Place steri strips            Medication List    STOP taking these medications         ibuprofen 200 MG tablet  Commonly known as:  ADVIL,MOTRIN      TAKE these medications        docusate sodium 100 MG capsule  Commonly known as:  COLACE  Take 1 capsule (100 mg total) by mouth 2 (two) times daily.     HYDROcodone-acetaminophen 5-325 MG tablet  Commonly known as:  NORCO  Take 1-2 tablets by mouth every 6 (six) hours as needed for moderate pain.     naproxen 500 MG tablet  Commonly known as:  NAPROSYN  Take 1 tablet (500 mg total) by mouth 2 (two) times daily with a meal.     simethicone 80 MG chewable tablet  Commonly known as:  MYLICON  Chew 1 tablet (80 mg total) by mouth 4 (four) times daily as needed for flatulence.           Follow-up Information    Follow up with Lisa Rosenstock, MD In 2 weeks.   Specialty:  Obstetrics and Gynecology   Why:  For wound re-check   Contact information:   [address] [place] [address] [phone]  Signed: [name] 03/04/2015, 10:02 AM

## 2015-03-04 NOTE — Progress Notes (Signed)
Discharge instructions complete and prescriptions given. Patient discharged home at 1230.

## 2015-08-30 ENCOUNTER — Encounter: Payer: Self-pay | Admitting: Podiatry

## 2015-08-30 ENCOUNTER — Ambulatory Visit (INDEPENDENT_AMBULATORY_CARE_PROVIDER_SITE_OTHER): Payer: BLUE CROSS/BLUE SHIELD | Admitting: Podiatry

## 2015-08-30 VITALS — BP 126/78 | HR 68 | Resp 16

## 2015-08-30 DIAGNOSIS — G43909 Migraine, unspecified, not intractable, without status migrainosus: Secondary | ICD-10-CM | POA: Insufficient documentation

## 2015-08-30 DIAGNOSIS — N879 Dysplasia of cervix uteri, unspecified: Secondary | ICD-10-CM | POA: Insufficient documentation

## 2015-08-30 DIAGNOSIS — R87619 Unspecified abnormal cytological findings in specimens from cervix uteri: Secondary | ICD-10-CM | POA: Insufficient documentation

## 2015-08-30 DIAGNOSIS — L603 Nail dystrophy: Secondary | ICD-10-CM | POA: Diagnosis not present

## 2015-08-30 DIAGNOSIS — I8390 Asymptomatic varicose veins of unspecified lower extremity: Secondary | ICD-10-CM | POA: Insufficient documentation

## 2015-08-30 DIAGNOSIS — N920 Excessive and frequent menstruation with regular cycle: Secondary | ICD-10-CM | POA: Insufficient documentation

## 2015-08-30 NOTE — Patient Instructions (Signed)

## 2015-08-30 NOTE — Progress Notes (Signed)
   Subjective:    Patient ID: Lisa Dunn, female    DOB: December 24, 1980, 35 y.o.   MRN: [id number]  HPI: She presents today with a chief complaint of white discoloration of her toenails for over a year now she states that some of the toenails are thicker than they were before she states that she does keep them painted regularly. She has tried over-the-counter medications for the discoloration but nothing seems to help.    Review of Systems  All other systems reviewed and are negative.      Objective:   Physical Exam: Vital signs are stable alert 3 very pleasant 35 year old female in no apparent distress. Pulses are strongly palpable. Neurologic sensorium is intact. Deep tendon reflexes are intact. Muscle strength is normal bilateral. Orthopedic evaluation demonstrates all joints distal to the ankle full range of motion without crepitation. Cutaneous evaluation demonstrates onychomycosis with some superficial white onychomycosis as well as discoloration due to the binders in the toenail polish.        Assessment & Plan:  Nail dystrophy with possible onychomycosis.  Plan: Samples of the toenails today to be sent for pathologic evaluation we'll notify her once we have obtained the results.

## 2015-09-27 ENCOUNTER — Ambulatory Visit: Payer: BLUE CROSS/BLUE SHIELD | Admitting: Podiatry

## 2015-11-15 ENCOUNTER — Ambulatory Visit: Payer: BLUE CROSS/BLUE SHIELD | Admitting: Podiatry

## 2015-11-29 ENCOUNTER — Ambulatory Visit: Payer: BLUE CROSS/BLUE SHIELD | Admitting: Podiatry

## 2017-09-26 ENCOUNTER — Other Ambulatory Visit: Payer: Self-pay

## 2017-09-26 ENCOUNTER — Emergency Department
Admission: EM | Admit: 2017-09-26 | Discharge: 2017-09-26 | Disposition: A | Discharge status: Home / Self Care | Payer: BLUE CROSS/BLUE SHIELD | Attending: Emergency Medicine | Admitting: Emergency Medicine

## 2017-09-26 ENCOUNTER — Emergency Department: Payer: BLUE CROSS/BLUE SHIELD

## 2017-09-26 DIAGNOSIS — Z87891 Personal history of nicotine dependence: Secondary | ICD-10-CM | POA: Diagnosis not present

## 2017-09-26 DIAGNOSIS — R55 Syncope and collapse: Secondary | ICD-10-CM

## 2017-09-26 DIAGNOSIS — R109 Unspecified abdominal pain: Secondary | ICD-10-CM | POA: Diagnosis present

## 2017-09-26 LAB — CBC
HEMATOCRIT: 39.6 % (ref 35.0–47.0)
Hemoglobin: 13.4 g/dL (ref 12.0–16.0)
MCH: 30.1 pg (ref 26.0–34.0)
MCHC: 34 g/dL (ref 32.0–36.0)
MCV: 88.6 fL (ref 80.0–100.0)
PLATELETS: 314 10*3/uL (ref 150–440)
RBC: 4.46 MIL/uL (ref 3.80–5.20)
RDW: 13.1 % (ref 11.5–14.5)
WBC: 9 10*3/uL (ref 3.6–11.0)

## 2017-09-26 LAB — URINALYSIS, COMPLETE (UACMP) WITH MICROSCOPIC
BACTERIA UA: NONE SEEN
BILIRUBIN URINE: NEGATIVE
Glucose, UA: NEGATIVE mg/dL
KETONES UR: 5 mg/dL — AB
LEUKOCYTES UA: NEGATIVE
Nitrite: NEGATIVE
PH: 6 (ref 5.0–8.0)
Protein, ur: NEGATIVE mg/dL
Specific Gravity, Urine: 1.02 (ref 1.005–1.030)

## 2017-09-26 LAB — LIPASE, BLOOD: Lipase: 39 U/L (ref 11–51)

## 2017-09-26 LAB — BASIC METABOLIC PANEL
Anion gap: 8 (ref 5–15)
BUN: 17 mg/dL (ref 6–20)
CHLORIDE: 104 mmol/L (ref 101–111)
CO2: 22 mmol/L (ref 22–32)
Calcium: 9.2 mg/dL (ref 8.9–10.3)
Creatinine, Ser: 0.78 mg/dL (ref 0.44–1.00)
GFR calc non Af Amer: >60 mL/min (ref >60)
Glucose, Bld: 101 mg/dL — ABNORMAL HIGH (ref 65–99)
POTASSIUM: 3.9 mmol/L (ref 3.5–5.1)
SODIUM: 134 mmol/L — AB (ref 135–145)

## 2017-09-26 LAB — POCT PREGNANCY, URINE: PREG TEST UR: NEGATIVE

## 2017-09-26 NOTE — ED Triage Notes (Signed)
Pt states last night she had abd pain that has since resolved. State she went to bathroom this AM and passed out. Thinks she hit head d/t R anterior head hurting. "I woke up with seizure like symptoms." alert, oriented, ambulatory, no distress noted.

## 2017-09-26 NOTE — ED Provider Notes (Signed)
[facility] Emergency Department Provider Note  Time seen: 4:10 PM  I have reviewed the triage vital signs and the nursing notes.   HISTORY  Chief Complaint Abdominal Pain and Loss of Consciousness    HPI Lisa Dunn is a 37 y.o. female with no significant past medical history presents to the emergency department for syncopal episode.  According to the patient this morning she was having a lot of abdominal discomfort like she needed to have diarrhea.  States she was not able to have any diarrhea, states she did have one loose bowel movement while having a bowel movement patient states she was very hot and dizzy.  Became very sweaty.  States she was not feeling very well wanted to go back to bed to lay down so she wiped and stood up, upon standing up patient had a syncopal episode.  Husband states she heard the patient fall so he came into the room and helped the patient back to bed.  She states shortly afterwards she had to have another bowel movement which was diarrhea.  She states after that she has felt very normal.  Denies any further dizziness or lightheadedness.  Denies any abdominal discomfort.  Denies any nausea or vomiting at any point.  Denies any fever.  Patient has a history of syncopal episode states this is the fourth syncopal episode in her life.  Denies any chest pain or trouble breathing.  Patient states when she woke up she did notice that her arms were shaking very badly which concerned her for a possible seizure.  No seizure history.  Patient did hit her head, right forehead per patient.   Past Medical History:  Diagnosis Date  . Headache     Patient Active Problem List   Diagnosis Date Noted  . Abnormal cells of cervix 08/30/2015  . Excessive and frequent menstruation 08/30/2015  . Headache, migraine 08/30/2015  . 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 Post-operative state 03/01/2015    Past Surgical History:  Procedure Laterality Date  .  ABDOMINAL HYSTERECTOMY N/A 03/01/2015   Procedure: HYSTERECTOMY ABDOMINAL;  Surgeon: Boykin Nearing, MD;  Location: [facility] ORS;  Service: Gynecology;  Laterality: N/A;  . BILATERAL SALPINGECTOMY  03/01/2015   Procedure: BILATERAL SALPINGECTOMY;  Surgeon: Boykin Nearing, MD;  Location: [facility] ORS;  Service: Gynecology;;  . CERVICAL BIOPSY  W/ LOOP ELECTRODE EXCISION    . CESAREAN SECTION  2002, 2005, 2009  . DIAGNOSTIC LAPAROSCOPY    . TUBAL LIGATION      Prior to Admission medications   Not on File    No Known Allergies  Family History  Problem Relation Age of Onset  . Hypertension Mother     Social History Social History   Tobacco Use  . Smoking status: Former Smoker    Types: Cigarettes    Last attempt to quit: 08/23/2006    Years since quitting: 11.1  . Smokeless tobacco: Never Used  Substance Use Topics  . Alcohol use: Not on file    Comment: occ  . Drug use: No    Review of Systems Constitutional: Negative for fever.  Positive for syncope. Eyes: Negative for visual complaints ENT: Negative for recent illness/congestion Cardiovascular: Negative for chest pain. Respiratory: Negative for shortness of breath. Gastrointestinal: Positive for abdominal discomfort, now resolved.  Positive for diarrhea, now resolved.  Negative for nausea or vomiting. Genitourinary: Negative for urinary compaints Musculoskeletal: Negative for musculoskeletal complaints Skin: Negative for skin complaints  Neurological: Moderate headache.  Did hit head. All other ROS negative  ____________________________________________   PHYSICAL EXAM:  VITAL SIGNS: ED Triage Vitals  Enc Vitals Group     BP 09/26/17 1428 (!) 140/91     Pulse Rate 09/26/17 1428 74     Resp 09/26/17 1428 16     Temp 09/26/17 1428 98.6 F (37 C)     Temp Source 09/26/17 1428 Oral     SpO2 09/26/17 1428 100 %     Weight 09/26/17 1429 156 lb (70.8 kg)     Height 09/26/17 1429 5\' 2"  (1.575 m)     Head  Circumference --      Peak Flow --      Pain Score 09/26/17 1428 8     Pain Loc --      Pain Edu? --      Excl. in [place]? --     Constitutional: Alert and oriented. Well appearing and in no distress. Eyes: Normal exam ENT   Head: Normocephalic and atraumatic.   Mouth/Throat: Mucous membranes are moist. Cardiovascular: Normal rate, regular rhythm. No murmur Respiratory: Normal respiratory effort without tachypnea nor retractions. Breath sounds are clear  Gastrointestinal: Soft and nontender. No distention.   Musculoskeletal: Nontender with normal range of motion in all extremities.  Neurologic:  Normal speech and language. No gross focal neurologic deficits Skin:  Skin is warm, dry and intact.  Psychiatric: Mood and affect are normal.   ____________________________________________    EKG  EKG reviewed and interpreted by myself shows normal sinus rhythm at 75 bpm with a narrow QRS, normal axis, normal intervals, no concerning ST changes.  ____________________________________________    RADIOLOGY  CT had negative  ____________________________________________   INITIAL IMPRESSION / ASSESSMENT AND PLAN / ED COURSE  Pertinent labs & imaging results that were available during my care of the patient were reviewed by me and considered in my medical decision making (see chart for details).  Patient presents to the emergency department after syncopal episode.  Differential would include vagal episode, orthostatic syncope, dehydration.  Overall the patient appears very well, normal physical exam.  Patient denies any discomfort besides a mild headache.  CT scan of the head is negative.  Labs are largely within normal limits.  Patient's description of the events is very suspicious for a vagal episode.  Urinalysis pending.  If the urinalysis is normal anticipate likely discharge home with oral hydration and PCP follow-up.  Patient's urinalysis is equivocal, urine culture has been  added but with no urinary complaints we will hold off on treatment.  Urine patency test is negative.  I discussed with the patient supportive care at home including plenty of fluids and plenty of rest.  Patient will follow-up with her doctor.  I also discussed return precautions for any further syncopal events or development of any chest pain or any other symptom personally concerning to yourself.  ____________________________________________   FINAL CLINICAL IMPRESSION(S) / ED DIAGNOSES  Syncope    Harvest Dark, MD 09/26/17 1657

## 2017-09-26 NOTE — ED Notes (Signed)
Pt is being discharged to home. Pt  Is AOx4, VSS, she denies any pain or syncope at this time. AVS was given and explained to the pt and she verbalized understanding. Work noted provided to pt.

## 2017-09-28 LAB — URINE CULTURE: Culture: NO GROWTH

## 2017-11-21 ENCOUNTER — Other Ambulatory Visit: Payer: Self-pay | Admitting: Internal Medicine

## 2017-11-21 DIAGNOSIS — R109 Unspecified abdominal pain: Secondary | ICD-10-CM

## 2017-11-27 ENCOUNTER — Ambulatory Visit
Admission: RE | Admit: 2017-11-27 | Discharge: 2017-11-27 | Disposition: A | Discharge status: Home / Self Care | Payer: BLUE CROSS/BLUE SHIELD | Source: Ambulatory Visit | Attending: Internal Medicine | Admitting: Internal Medicine

## 2017-11-27 DIAGNOSIS — R109 Unspecified abdominal pain: Secondary | ICD-10-CM | POA: Diagnosis not present

## 2018-08-19 DIAGNOSIS — Z01419 Encounter for gynecological examination (general) (routine) without abnormal findings: Secondary | ICD-10-CM | POA: Diagnosis not present

## 2018-08-21 DIAGNOSIS — Z719 Counseling, unspecified: Secondary | ICD-10-CM | POA: Diagnosis not present

## 2018-09-30 ENCOUNTER — Other Ambulatory Visit: Payer: Self-pay | Admitting: Obstetrics and Gynecology

## 2018-09-30 DIAGNOSIS — Z1231 Encounter for screening mammogram for malignant neoplasm of breast: Secondary | ICD-10-CM

## 2018-11-08 ENCOUNTER — Encounter: Payer: Self-pay | Admitting: Podiatry

## 2018-11-08 ENCOUNTER — Other Ambulatory Visit: Payer: Self-pay

## 2018-11-08 ENCOUNTER — Ambulatory Visit (INDEPENDENT_AMBULATORY_CARE_PROVIDER_SITE_OTHER): Payer: 59 | Admitting: Podiatry

## 2018-11-08 VITALS — Temp 97.3°F

## 2018-11-08 DIAGNOSIS — B351 Tinea unguium: Secondary | ICD-10-CM | POA: Diagnosis not present

## 2018-11-08 DIAGNOSIS — L600 Ingrowing nail: Secondary | ICD-10-CM

## 2018-11-08 MED ORDER — TERBINAFINE HCL 250 MG PO TABS: 250.0000 mg | ORAL_TABLET | Freq: Every day | ORAL | 0 refills | Status: DC

## 2018-11-11 NOTE — Progress Notes (Signed)
   Subjective: Patient presents today for evaluation of pain to the medial border of the right hallux that began about one month ago. Patient is concerned for possible ingrown nail. Applying pressure to the toe increases the pain. She has not done anything for treatment.  She also complains of nail fungus to nails 1-5 bilaterally that has been present for the past few years. She has not done anything for treatment and denies modifying factors. Patient presents today for further treatment and evaluation.  Past Medical History:  Diagnosis Date  . Headache     Objective:  General: Well developed, nourished, in no acute distress, alert and oriented x3   Dermatology: Skin is warm, dry and supple bilateral. Medial border of the right hallux appears to be erythematous with evidence of an ingrowing nail. Pain on palpation noted to the border of the nail fold. Hyperkeratotic, discolored, thickened, onychodystrophy of nails 1-5 bilaterally. There are no open sores, lesions.  Vascular: Dorsalis Pedis artery and Posterior Tibial artery pedal pulses palpable. No lower extremity edema noted.   Neruologic: Grossly intact via light touch bilateral.  Musculoskeletal: Muscular strength within normal limits in all groups bilateral. Normal range of motion noted to all pedal and ankle joints.   Assesement: #1 Paronychia with ingrowing nail medial border right hallux  #2 Incurvated nail #3 Onychomycosis nails 1-5 bilateral   Plan of Care:  1. Patient evaluated.  2. Discussed treatment alternatives and plan of care. Explained nail avulsion procedure and post procedure course to patient. 3. Patient opted for temporary partial nail avulsion of the medial border of the right hallux.  4. Prior to procedure, local anesthesia infiltration utilized using 3 ml of a 50:50 mixture of 2% plain lidocaine and 0.5% plain marcaine in a normal hallux block fashion and a betadine prep performed.  5. Light dressing applied.  6. Prescription for Lamisil 250 mg #90 provided to patient.  7. Return to clinic as needed.  Works at [employer].    Edrick Kins, DPM [facility]  Dr. Edrick Kins, [address]                                        [place], [place] [address]                Office [phone]  Fax [phone]

## 2019-04-21 ENCOUNTER — Other Ambulatory Visit: Payer: Self-pay | Admitting: Internal Medicine

## 2019-04-21 DIAGNOSIS — R1031 Right lower quadrant pain: Secondary | ICD-10-CM

## 2019-04-21 DIAGNOSIS — R1032 Left lower quadrant pain: Secondary | ICD-10-CM

## 2019-04-21 DIAGNOSIS — K581 Irritable bowel syndrome with constipation: Secondary | ICD-10-CM | POA: Insufficient documentation

## 2019-05-01 ENCOUNTER — Ambulatory Visit
Admission: RE | Admit: 2019-05-01 | Discharge: 2019-05-01 | Disposition: A | Discharge status: Home / Self Care | Payer: 59 | Source: Ambulatory Visit | Attending: Internal Medicine | Admitting: Internal Medicine

## 2019-05-01 ENCOUNTER — Other Ambulatory Visit: Payer: Self-pay

## 2019-05-01 DIAGNOSIS — R1032 Left lower quadrant pain: Secondary | ICD-10-CM | POA: Insufficient documentation

## 2019-05-01 DIAGNOSIS — R1031 Right lower quadrant pain: Secondary | ICD-10-CM | POA: Insufficient documentation

## 2019-05-01 DIAGNOSIS — K581 Irritable bowel syndrome with constipation: Secondary | ICD-10-CM | POA: Diagnosis present

## 2019-05-01 MED ORDER — IOHEXOL 300 MG/ML  SOLN
100.0000 mL | Freq: Once | INTRAMUSCULAR | Status: AC | PRN
  Administered 2019-05-01: 08:00:00 100 mL via INTRAVENOUS

## 2019-05-02 ENCOUNTER — Other Ambulatory Visit: Payer: Self-pay | Admitting: Internal Medicine

## 2019-05-02 DIAGNOSIS — R1900 Intra-abdominal and pelvic swelling, mass and lump, unspecified site: Secondary | ICD-10-CM

## 2019-05-12 ENCOUNTER — Ambulatory Visit
Admission: RE | Admit: 2019-05-12 | Discharge: 2019-05-12 | Disposition: A | Discharge status: Home / Self Care | Payer: 59 | Source: Ambulatory Visit | Attending: Internal Medicine | Admitting: Internal Medicine

## 2019-05-12 ENCOUNTER — Other Ambulatory Visit: Payer: Self-pay

## 2019-05-12 DIAGNOSIS — R1900 Intra-abdominal and pelvic swelling, mass and lump, unspecified site: Secondary | ICD-10-CM | POA: Diagnosis not present

## 2019-05-12 MED ORDER — GADOBUTROL 1 MMOL/ML IV SOLN
7.0000 mL | Freq: Once | INTRAVENOUS | Status: AC | PRN
  Administered 2019-05-12: 7 mL via INTRAVENOUS

## 2019-12-19 ENCOUNTER — Ambulatory Visit: Payer: 59 | Admitting: Podiatry

## 2020-01-02 ENCOUNTER — Ambulatory Visit: Payer: 59 | Admitting: Podiatry

## 2020-01-09 ENCOUNTER — Encounter: Payer: Self-pay | Admitting: Podiatry

## 2020-01-09 ENCOUNTER — Ambulatory Visit: Payer: 59 | Admitting: Podiatry

## 2020-01-09 ENCOUNTER — Other Ambulatory Visit: Payer: Self-pay

## 2020-01-09 DIAGNOSIS — L600 Ingrowing nail: Secondary | ICD-10-CM

## 2020-01-09 DIAGNOSIS — M79674 Pain in right toe(s): Secondary | ICD-10-CM | POA: Diagnosis not present

## 2020-01-09 DIAGNOSIS — B351 Tinea unguium: Secondary | ICD-10-CM

## 2020-01-09 MED ORDER — TERBINAFINE HCL 250 MG PO TABS: 250.0000 mg | ORAL_TABLET | Freq: Every day | ORAL | 0 refills | Status: AC

## 2020-01-09 MED ORDER — GENTAMICIN SULFATE 0.1 % EX CREA: 1.0000 "application " | TOPICAL_CREAM | Freq: Two times a day (BID) | CUTANEOUS | 1 refills | Status: AC

## 2020-01-09 NOTE — Patient Instructions (Signed)

## 2020-01-18 NOTE — Progress Notes (Signed)
   Subjective: Established patient presents today for evaluation of pain to the lateral border of the left great toe. Patient is concerned for possible ingrown nail.  Patient states that she has been dealing with this off and on for several years now.  She is also concerned for possible toenail fungus to the right fifth toenail.  It is very thick and discolored.  It is also very painful in shoes..  Patient presents today for further treatment and evaluation.  Past Medical History:  Diagnosis Date  . Headache     Objective:  General: Well developed, nourished, in no acute distress, alert and oriented x3   Dermatology: Skin is warm, dry and supple bilateral.  Lateral border of the left great toe appears to be erythematous with evidence of an ingrowing nail. Pain on palpation noted to the border of the nail fold. The remaining nails appear unremarkable at this time. There are no open sores, lesions. Hyperkeratotic dystrophic discolored nail also noted to the fifth digit of the right foot consistent with findings of onychomycosis  Vascular: Dorsalis Pedis artery and Posterior Tibial artery pedal pulses palpable. No lower extremity edema noted.   Neruologic: Grossly intact via light touch bilateral.  Musculoskeletal: Muscular strength within normal limits in all groups bilateral. Normal range of motion noted to all pedal and ankle joints.   Assesement: #1 Paronychia with ingrowing nail lateral border left great toe #2 Pain in toe #3  Onychomycosis of toenail right fifth toe  Plan of Care:  1. Patient evaluated.  2. Discussed treatment alternatives and plan of care. Explained nail avulsion procedure and post procedure course to patient. 3. Patient opted for permanent partial nail avulsion of the lateral border left great toe.  4. Prior to procedure, local anesthesia infiltration utilized using 3 ml of a 50:50 mixture of 2% plain lidocaine and 0.5% plain marcaine in a normal hallux block  fashion and a betadine prep performed.  5. Partial permanent nail avulsion with chemical matrixectomy performed using [id number] applications of phenol followed by alcohol flush.  6. Light dressing applied. 7.  Prescription for gentamicin cream to apply daily  8.  Prescription for Lamisil 250 mg #90 daily.  Patient denies any hepatic or liver pathology or symptoms.   9.  Return to clinic 2 weeks.  Edrick Kins, DPM [facility]  Dr. Edrick Kins, [address]                                        [place], [place] [address]                Office [phone]  Fax [phone]

## 2020-01-23 ENCOUNTER — Encounter: Payer: Self-pay | Admitting: Podiatry

## 2020-01-23 ENCOUNTER — Ambulatory Visit: Payer: 59 | Admitting: Podiatry

## 2020-01-23 ENCOUNTER — Ambulatory Visit (INDEPENDENT_AMBULATORY_CARE_PROVIDER_SITE_OTHER): Payer: 59 | Admitting: Podiatry

## 2020-01-23 ENCOUNTER — Other Ambulatory Visit: Payer: Self-pay

## 2020-01-23 DIAGNOSIS — L600 Ingrowing nail: Secondary | ICD-10-CM | POA: Diagnosis not present

## 2020-01-23 DIAGNOSIS — B351 Tinea unguium: Secondary | ICD-10-CM | POA: Diagnosis not present

## 2020-01-23 DIAGNOSIS — M79674 Pain in right toe(s): Secondary | ICD-10-CM

## 2020-01-23 MED ORDER — DOXYCYCLINE HYCLATE 100 MG PO TABS: 100.0000 mg | ORAL_TABLET | Freq: Two times a day (BID) | ORAL | 0 refills | Status: AC

## 2020-01-23 NOTE — Progress Notes (Signed)
   Subjective: 39 y.o. female presents today status post permanent nail avulsion procedure of the medial border of the right great toe that was performed on 01/09/2020.  Patient states that she is feeling much better however over the last week her dog stepped on her toe and she has noticed some increased pain and tenderness to the nail plate avulsion site.  She has been applying antibiotic cream and a Band-Aid daily.  She has not been soaking her foot.  She presents for follow-up treatment evaluation  Past Medical History:  Diagnosis Date  . Headache     Objective: Skin is warm, dry and supple. Nail and respective nail fold appears to be healing appropriately. Open wound to the associated nail fold with a granular wound base and moderate amount of fibrotic tissue. Minimal drainage noted. Mild erythema around the periungual region likely due to phenol chemical matricectomy.  Assessment: #1 postop permanent partial nail avulsion medial border right great toe #2 open wound periungual nail fold of respective digit.   Plan of care: #1 patient was evaluated  #2 debridement of open wound was performed to the periungual border of the respective toe using a currette. Antibiotic ointment and Band-Aid was applied. #3  Due to some of the increased erythema around the nail matricectomy site with continued drainage I will call in a prescription for doxycycline 100 mg #20 two times daily  #4 patient is to return to clinic on a PRN basis.  *Works at [employer] here in [place].  Works in the parts [place] M. Korion Cuevas, DPM [facility]  Dr. Edrick Kins, [address]                                        [place], [place] [address]                Office [phone]  Fax [phone]

## 2021-02-18 ENCOUNTER — Other Ambulatory Visit: Payer: Self-pay | Admitting: Internal Medicine

## 2021-02-18 DIAGNOSIS — Z1231 Encounter for screening mammogram for malignant neoplasm of breast: Secondary | ICD-10-CM

## 2021-03-22 ENCOUNTER — Other Ambulatory Visit: Payer: Self-pay

## 2021-03-22 ENCOUNTER — Ambulatory Visit
Admission: RE | Admit: 2021-03-22 | Discharge: 2021-03-22 | Disposition: A | Discharge status: Home / Self Care | Payer: BC Managed Care – PPO | Source: Ambulatory Visit | Attending: Internal Medicine | Admitting: Internal Medicine

## 2021-03-22 DIAGNOSIS — Z1231 Encounter for screening mammogram for malignant neoplasm of breast: Secondary | ICD-10-CM | POA: Insufficient documentation

## 2022-03-08 ENCOUNTER — Other Ambulatory Visit: Payer: Self-pay | Admitting: Obstetrics and Gynecology

## 2022-03-08 DIAGNOSIS — Z1231 Encounter for screening mammogram for malignant neoplasm of breast: Secondary | ICD-10-CM

## 2022-04-25 ENCOUNTER — Ambulatory Visit
Admission: RE | Admit: 2022-04-25 | Discharge: 2022-04-25 | Disposition: A | Discharge status: Home / Self Care | Payer: BC Managed Care – PPO | Source: Ambulatory Visit | Attending: Obstetrics and Gynecology | Admitting: Obstetrics and Gynecology

## 2022-04-25 DIAGNOSIS — Z1231 Encounter for screening mammogram for malignant neoplasm of breast: Secondary | ICD-10-CM | POA: Diagnosis present

## 2022-09-07 IMAGING — MG MM DIGITAL SCREENING BILAT W/ TOMO AND CAD
8 series · 8 of 24 positions shown · non-contrast
Comparison: None.

CLINICAL DATA: Screening.

EXAM:
DIGITAL SCREENING BILATERAL MAMMOGRAM WITH TOMOSYNTHESIS AND CAD
TECHNIQUE: Bilateral screening digital craniocaudal and mediolateral oblique
mammograms were obtained. Bilateral screening digital breast
tomosynthesis was performed. The images were evaluated with
computer-aided detection.

[R CC synth-2D]
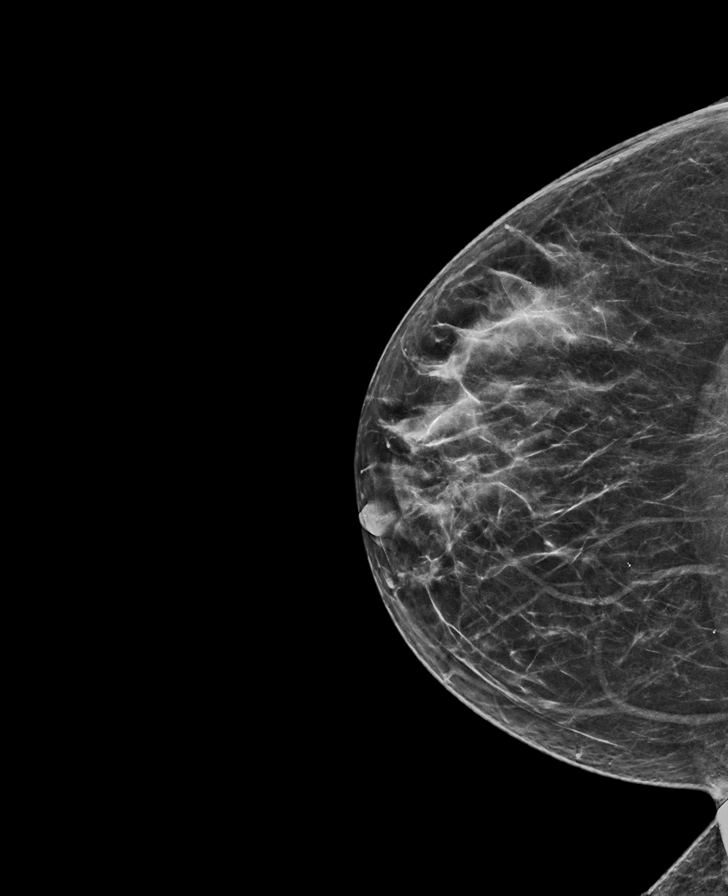

[L CC synth-2D]
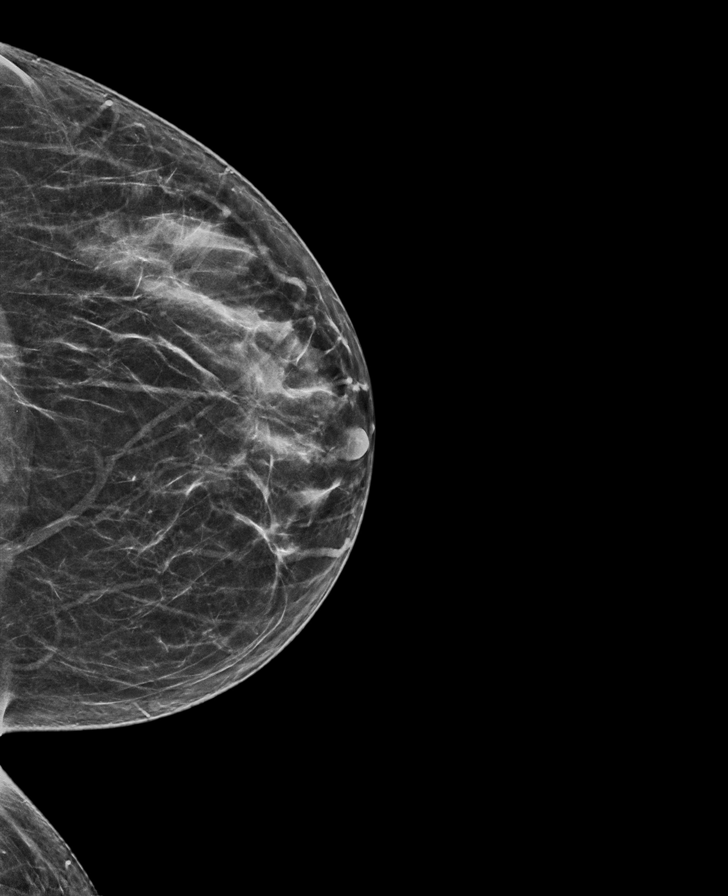

[L MLO synth-2D]
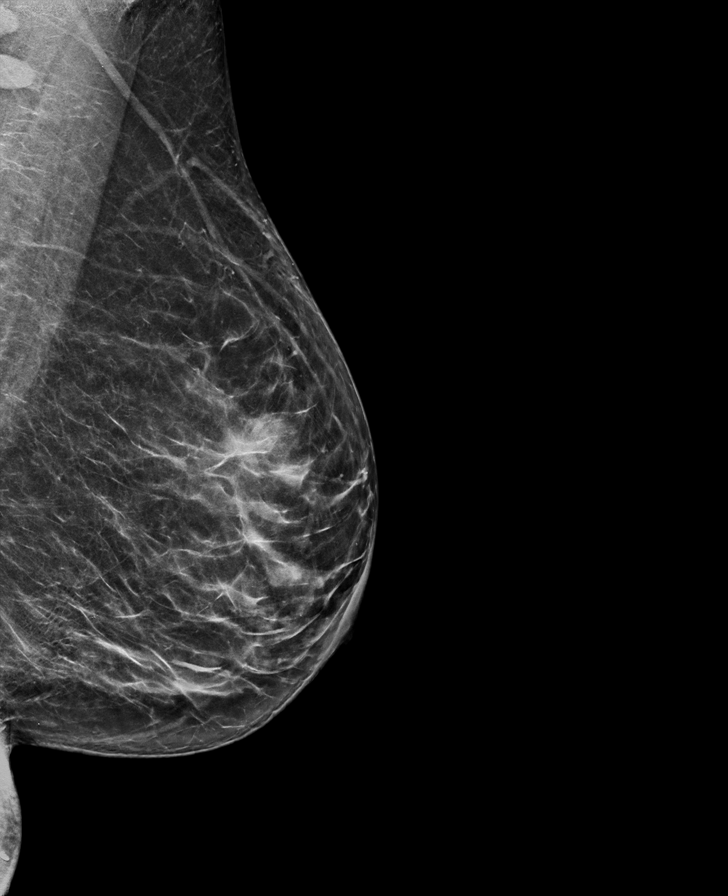

[R MLO synth-2D]
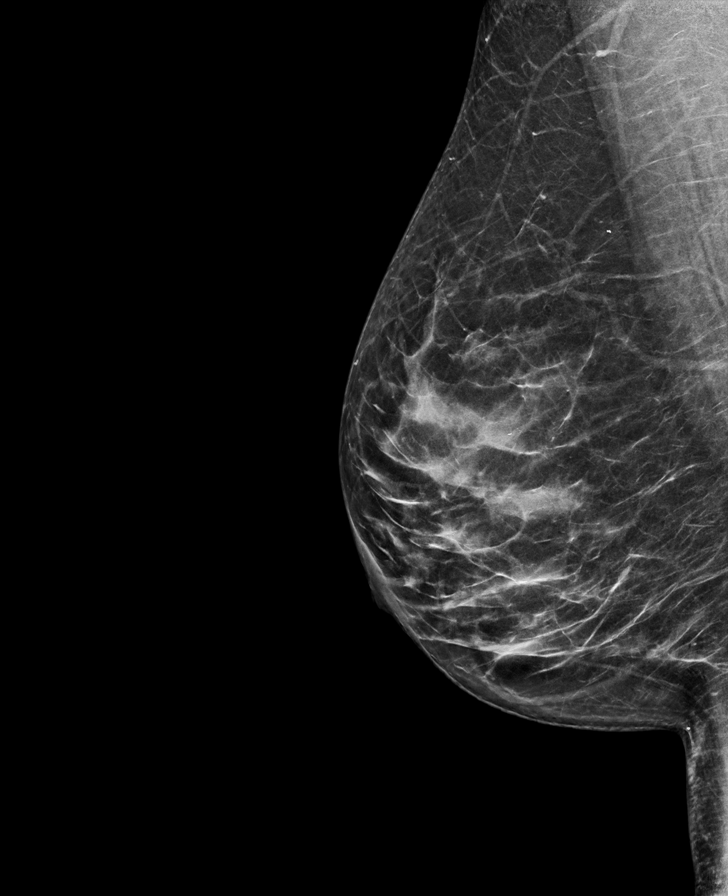

[R MLO tomo · tomo slice 33/65.0]
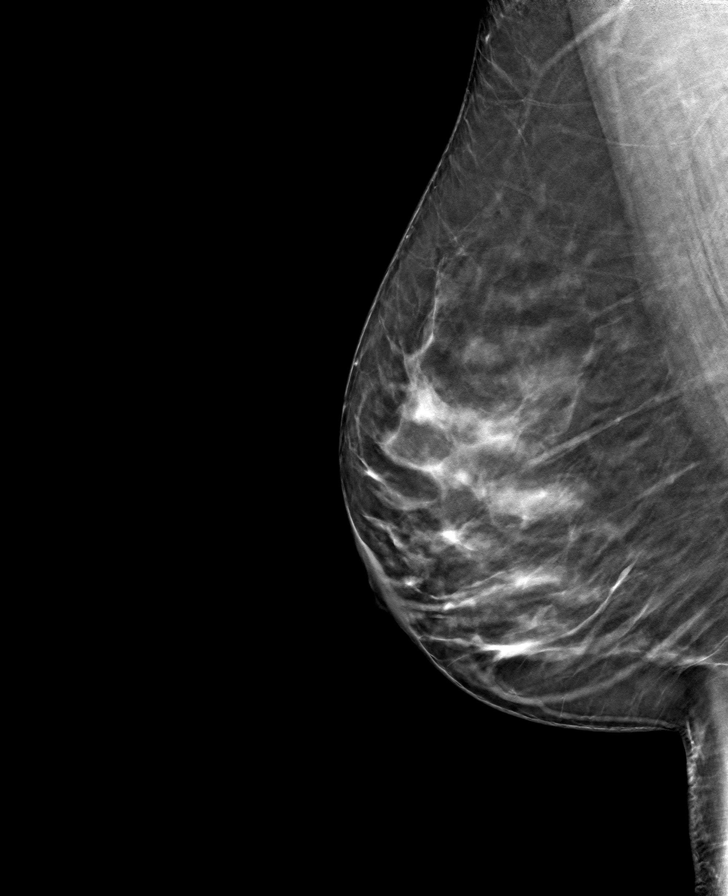

[L MLO tomo · tomo slice 35/68.0]
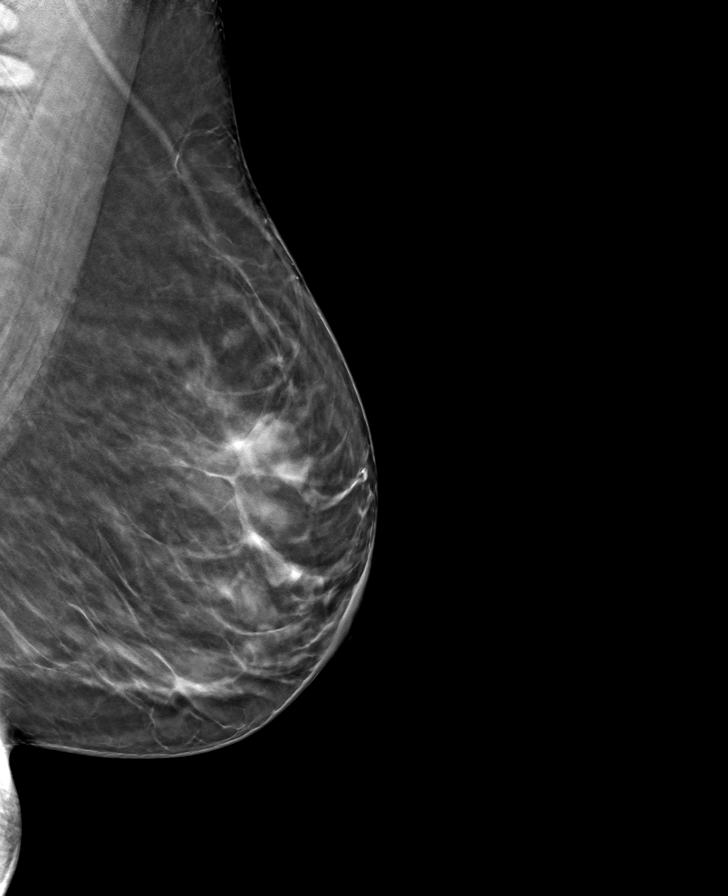

[R CC tomo · tomo slice 32/63.0]
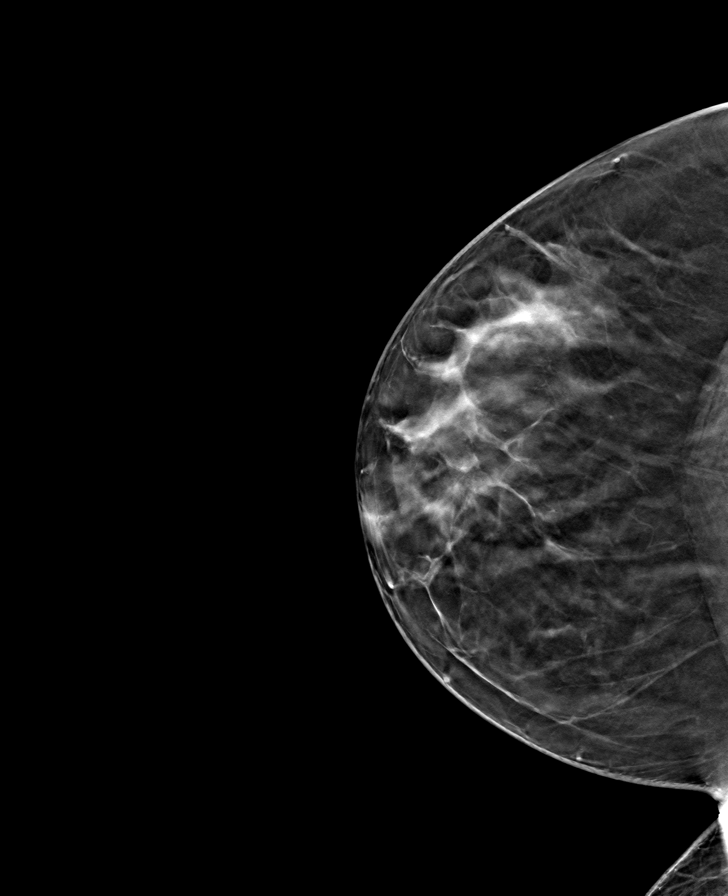

[L CC tomo · tomo slice 31/61.0]
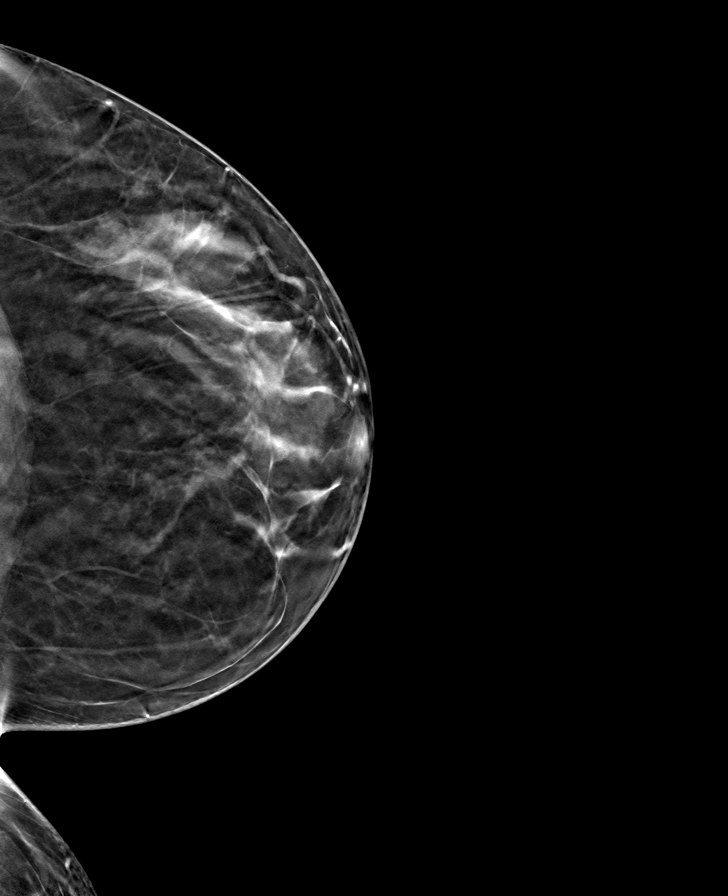

[8 of 24 positions shown; findings below may reference images not displayed]

ACR Breast Density Category b: There are scattered areas of
fibroglandular density.
FINDINGS: There are no findings suspicious for malignancy.
IMPRESSION: No mammographic evidence of malignancy. A result letter of this
screening mammogram will be mailed directly to the patient.

RECOMMENDATION:
Screening mammogram in one year. (Code:XG-X-X7B)

BI-RADS CATEGORY  1: Negative.

## 2023-03-13 ENCOUNTER — Other Ambulatory Visit: Payer: Self-pay | Admitting: Obstetrics and Gynecology

## 2023-03-13 DIAGNOSIS — Z1231 Encounter for screening mammogram for malignant neoplasm of breast: Secondary | ICD-10-CM

## 2023-04-27 ENCOUNTER — Ambulatory Visit
Admission: RE | Admit: 2023-04-27 | Discharge: 2023-04-27 | Disposition: A | Discharge status: Home / Self Care | Payer: BC Managed Care – PPO | Source: Ambulatory Visit | Attending: Obstetrics and Gynecology | Admitting: Obstetrics and Gynecology

## 2023-04-27 DIAGNOSIS — Z1231 Encounter for screening mammogram for malignant neoplasm of breast: Secondary | ICD-10-CM | POA: Diagnosis present

## 2023-05-02 ENCOUNTER — Other Ambulatory Visit: Payer: Self-pay | Admitting: Obstetrics and Gynecology

## 2023-05-02 DIAGNOSIS — R928 Other abnormal and inconclusive findings on diagnostic imaging of breast: Secondary | ICD-10-CM

## 2023-05-04 ENCOUNTER — Ambulatory Visit
Admission: RE | Admit: 2023-05-04 | Discharge: 2023-05-04 | Disposition: A | Discharge status: Home / Self Care | Payer: BC Managed Care – PPO | Source: Ambulatory Visit | Attending: Obstetrics and Gynecology | Admitting: Obstetrics and Gynecology

## 2023-05-04 DIAGNOSIS — R928 Other abnormal and inconclusive findings on diagnostic imaging of breast: Secondary | ICD-10-CM | POA: Diagnosis present

## 2024-04-01 ENCOUNTER — Other Ambulatory Visit: Payer: Self-pay | Admitting: Obstetrics and Gynecology

## 2024-04-01 DIAGNOSIS — Z1231 Encounter for screening mammogram for malignant neoplasm of breast: Secondary | ICD-10-CM

## 2024-05-05 ENCOUNTER — Ambulatory Visit
Admission: RE | Admit: 2024-05-05 | Discharge: 2024-05-05 | Disposition: A | Discharge status: Home / Self Care | Source: Ambulatory Visit | Attending: Obstetrics and Gynecology | Admitting: Obstetrics and Gynecology

## 2024-05-05 DIAGNOSIS — Z1231 Encounter for screening mammogram for malignant neoplasm of breast: Secondary | ICD-10-CM | POA: Diagnosis present
